# Patient Record
Sex: Female | Born: 2000 | Race: Black or African American | Hispanic: No | Marital: Single
Health system: Southern US, Community
[De-identification: ages and names within clinical notes are randomized; demographics above are authoritative.]

## PROBLEM LIST (undated history)

## (undated) HISTORY — PX: WISDOM TOOTH EXTRACTION: SHX21

---
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
## 2022-03-05 IMAGING — DX DG KNEE COMPLETE 4+V*L*
5 series · 5 of 5 positions shown · non-contrast
Comparison: December 04, 2019.

CLINICAL DATA: Acute left knee pain after injury yesterday.

EXAM:
LEFT KNEE - COMPLETE 4+ VIEW

[knee ap]
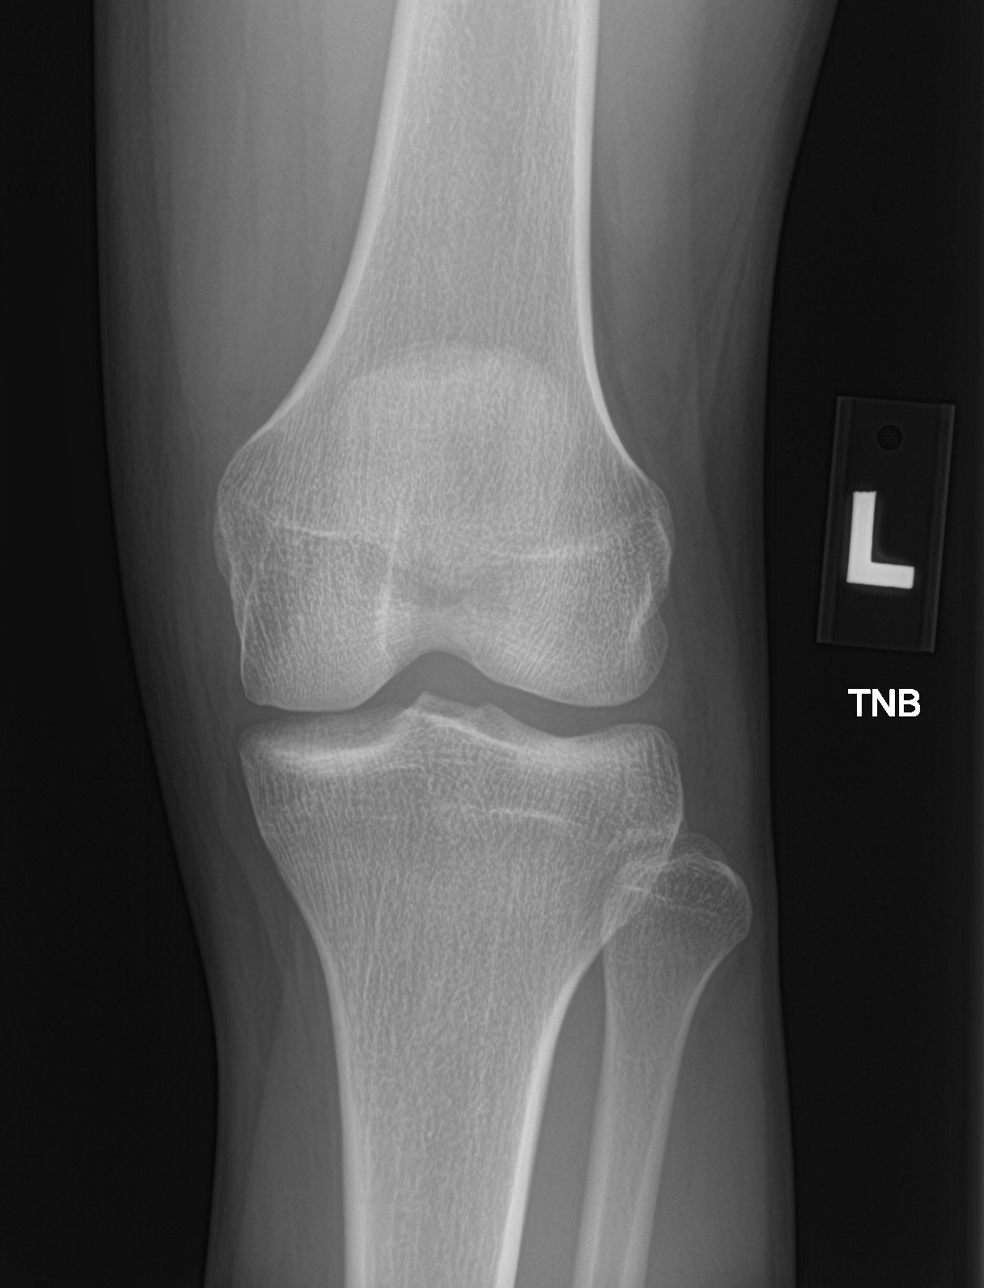

[knee obl (1 of 2)]
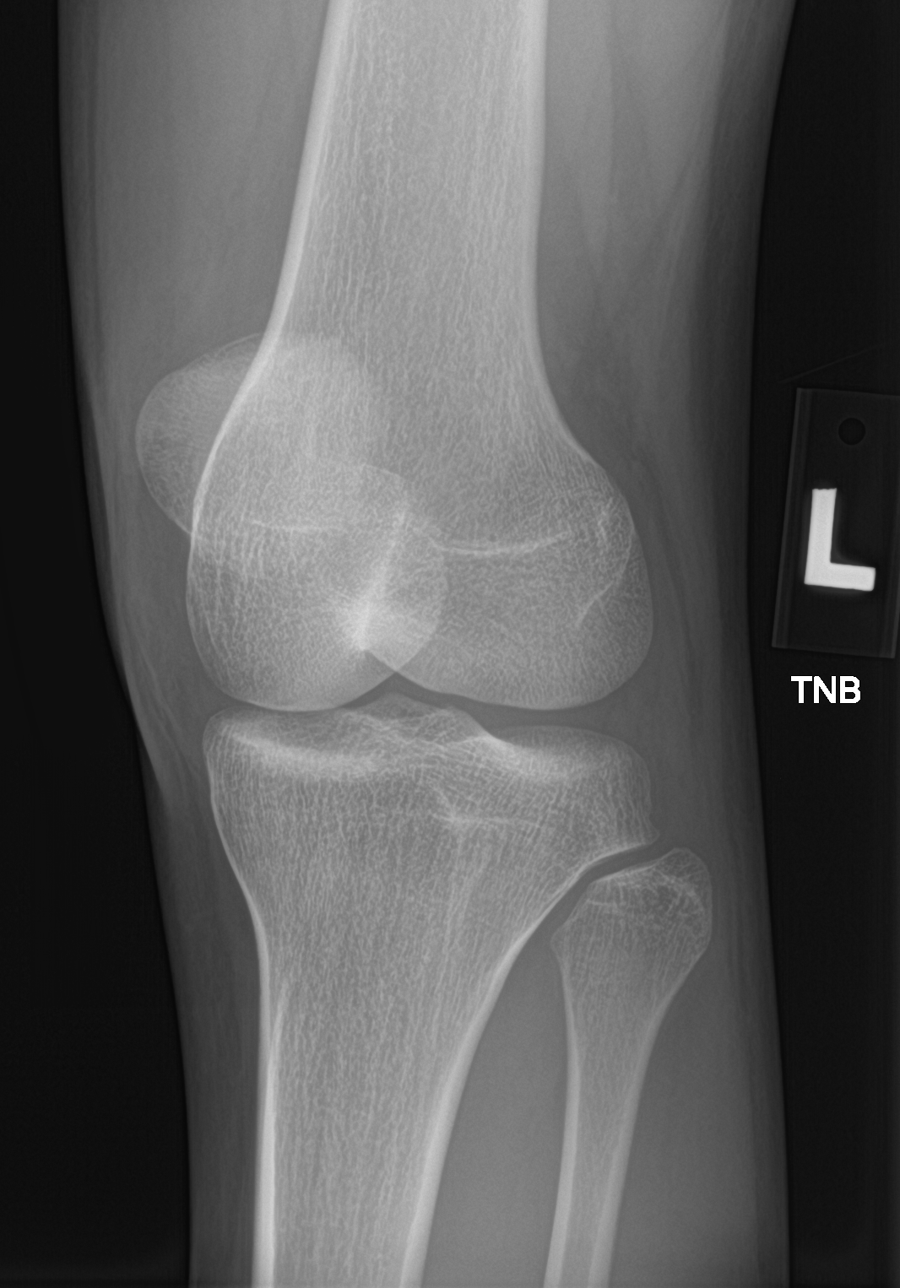

[knee obl (2 of 2)]
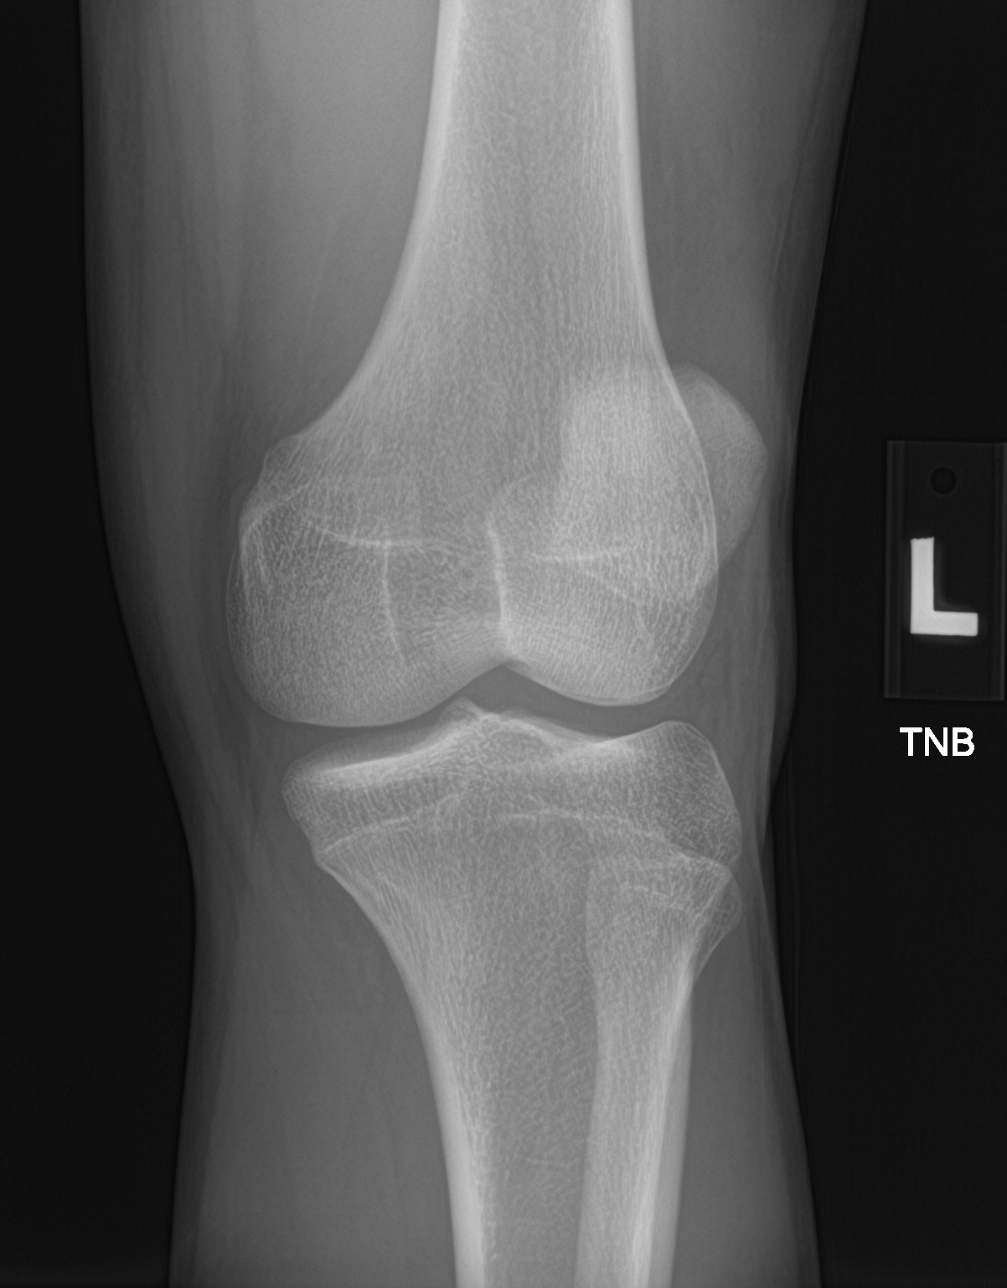

[knee lat]
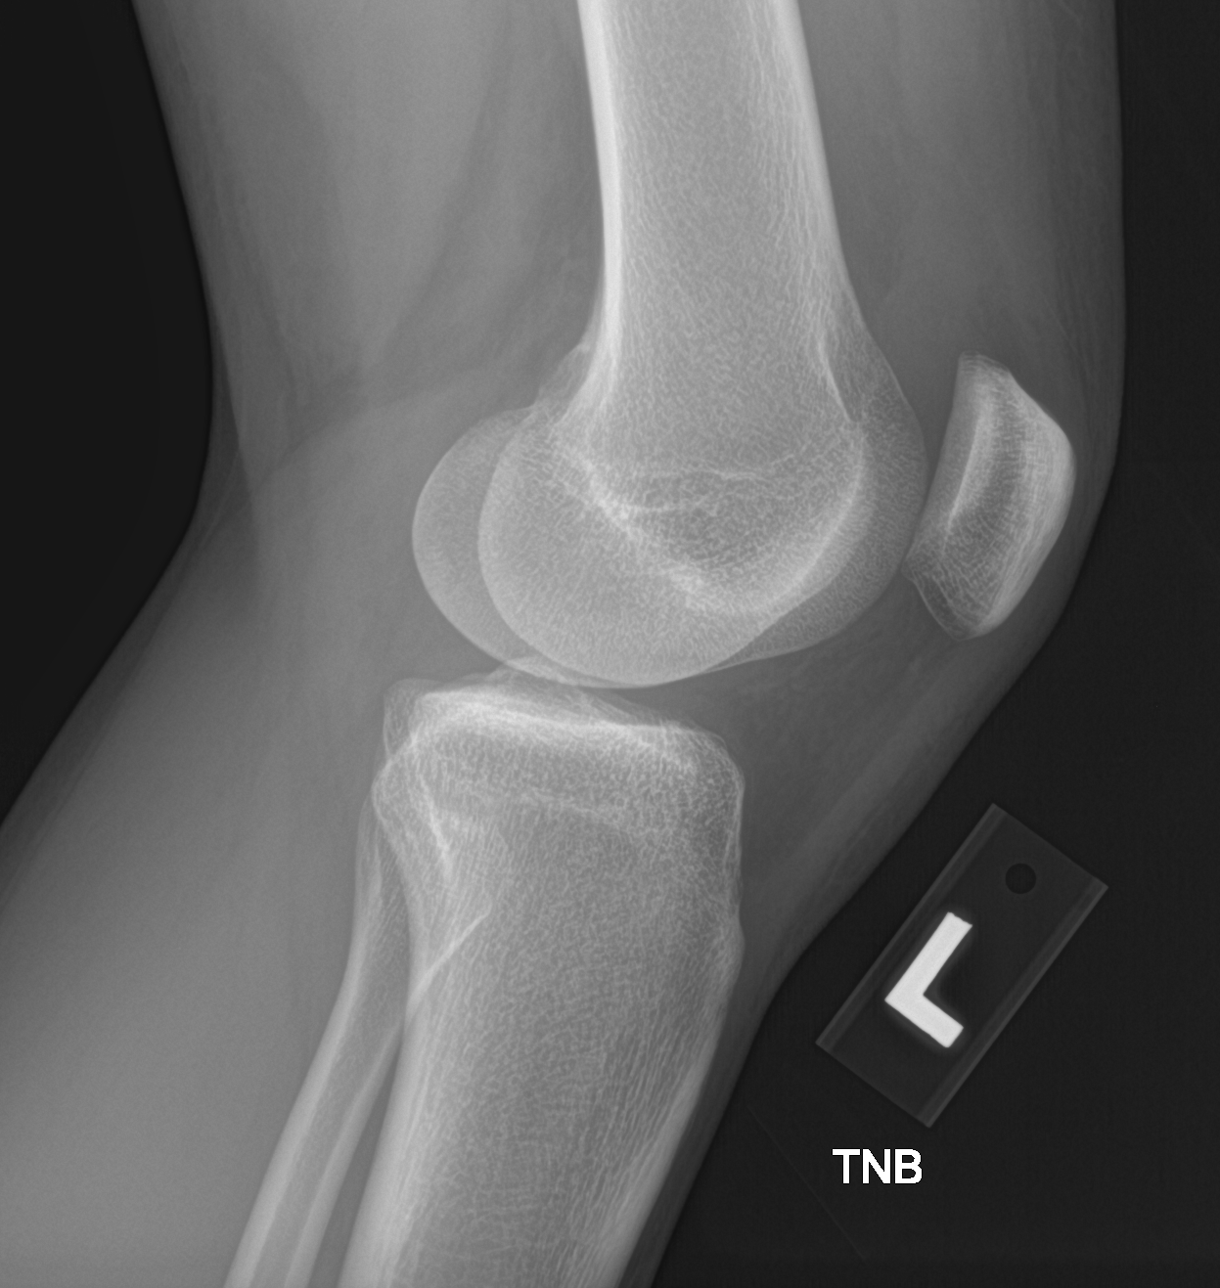

[knee sunrise]
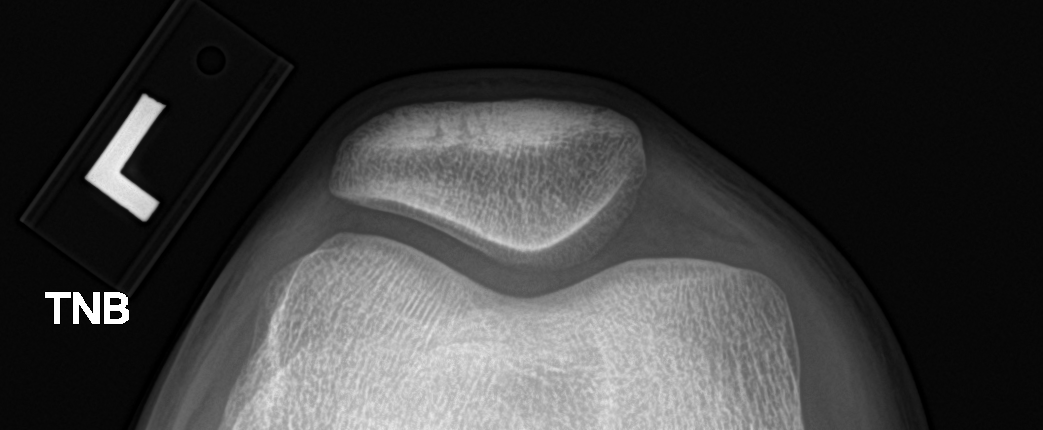

[5 of 5 positions shown; findings below may reference images not displayed]

FINDINGS: No evidence of fracture, dislocation, or joint effusion. No evidence
of arthropathy or other focal bone abnormality. Soft tissues are
unremarkable.
IMPRESSION: Negative.

## 2022-09-20 ENCOUNTER — Ambulatory Visit (HOSPITAL_COMMUNITY)
Admission: EM | Admit: 2022-09-20 | Discharge: 2022-09-20 | Disposition: A | Discharge status: Home / Self Care | Payer: Medicaid Other | Attending: Emergency Medicine | Admitting: Emergency Medicine

## 2022-09-20 ENCOUNTER — Encounter (HOSPITAL_COMMUNITY): Payer: Self-pay | Admitting: Emergency Medicine

## 2022-09-20 DIAGNOSIS — N760 Acute vaginitis: Secondary | ICD-10-CM | POA: Insufficient documentation

## 2022-09-20 MED ORDER — FLUCONAZOLE 150 MG PO TABS: 150.0000 mg | ORAL_TABLET | Freq: Once | ORAL | 0 refills | Status: AC

## 2022-09-20 NOTE — Discharge Instructions (Addendum)
Your symptoms appear consistent with a vaginal yeast infection, please take the fluconazole for this.  We have obtained a vaginal swab in clinic and we will call if we need to add any medication to your treatment plan or alter your treatment plan once those results.  Please abstain from intercourse until all results are received.  Please return to clinic for any new or concerning symptoms.

## 2022-09-20 NOTE — ED Triage Notes (Signed)
Vaginal itching and irritation starting two weeks ago, used the [employer] suppository one-day treatment with improvement of symptoms at that time. States two days ago her symptoms returned. Started menstrual cycle two days ago, denies dysuria.

## 2022-09-20 NOTE — ED Provider Notes (Signed)
[facility]    CSN: [id number] Arrival date & time: 09/20/22  1611      History   Chief Complaint Chief Complaint  Patient presents with   Vaginitis    HPI Sheila Carpenter is a 22 y.o. female.   Patient presents to clinic for vaginal itching and irritation that started 2 weeks ago.  She used a singular Monistat suppository that did help her symptoms, however, they returned 2 days ago.  She also started her menstrual cycle 2 days ago.  Is currently menstruating.  She denies dysuria, fevers or flank pain.  She is sexually active and does not use protection.   The history is provided by the patient and medical records.    Past Medical History:  Diagnosis Date   Asthma    Complete tear of anterior cruciate ligament of left knee 01/10/2020    Patient Active Problem List   Diagnosis Date Noted   Asthma    Complete tear of anterior cruciate ligament of left knee 01/10/2020   Sprain of tibiofibular ligament of right ankle 06/11/2017    Past Surgical History:  Procedure Laterality Date   KNEE ARTHROSCOPY WITH ANTERIOR CRUCIATE LIGAMENT (ACL) REPAIR Left 02/06/2020   Procedure: KNEE ARTHROSCOPY WITH ANTERIOR CRUCIATE LIGAMENT (ACL) REPAIR;  Surgeon: Salvatore Marvel, MD;  Location: [facility];  Service: Orthopedics;  Laterality: Left;   WISDOM TOOTH EXTRACTION      OB History   No obstetric history on file.      Home Medications    Prior to Admission medications   Medication Sig Start Date End Date Taking? Authorizing Provider  fluconazole (DIFLUCAN) 150 MG tablet Take 1 tablet (150 mg total) by mouth once for 1 dose. 09/20/22 09/20/22 Yes Rinaldo Ratel, [place] N, FNP  albuterol (PROVENTIL HFA;VENTOLIN HFA) 108 (90 Base) MCG/ACT inhaler Inhale 2 puffs into the lungs every 6 (six) hours as needed for wheezing or shortness of breath. 03/25/18   Sudie Grumbling, NP  oxyCODONE (OXY IR/ROXICODONE) 5 MG immediate release tablet Take 1 tablet (5 mg total) by  mouth every 4 (four) hours as needed for severe pain. 02/06/20   Shepperson, Kirstin, PA-C  promethazine (PHENERGAN) 12.5 MG tablet Take 1 tablet (12.5 mg total) by mouth every 8 (eight) hours as needed for nausea or vomiting. 02/06/20   Shepperson, Kirstin, PA-C    Family History Family History  Problem Relation Age of Onset   Healthy Mother    Hyperlipidemia Father    Hypertension Father     Social History Social History   Tobacco Use   Smoking status: Passive Smoke Exposure - Never Smoker   Smokeless tobacco: Never  Vaping Use   Vaping Use: Never used  Substance Use Topics   Alcohol use: No     Allergies   Patient has no known allergies.   Review of Systems Review of Systems  Constitutional:  Negative for fever.  Genitourinary:  Positive for vaginal discharge. Negative for difficulty urinating, dysuria, flank pain, hematuria and menstrual problem.     Physical Exam Triage Vital Signs ED Triage Vitals  Enc Vitals Group     BP 09/20/22 1632 109/77     Pulse Rate 09/20/22 1632 83     Resp 09/20/22 1632 16     Temp 09/20/22 1632 97.8 F (36.6 C)     Temp Source 09/20/22 1632 Oral     SpO2 09/20/22 1632 99 %     Weight --  Height --      Head Circumference --      Peak Flow --      Pain Score 09/20/22 1634 6     Pain Loc --      Pain Edu? --      Excl. in GC? --    No data found.  Updated Vital Signs BP 109/77 (BP Location: Left Arm)   Pulse 83   Temp 97.8 F (36.6 C) (Oral)   Resp 16   LMP 09/17/2022   SpO2 99%   Visual Acuity Right Eye Distance:   Left Eye Distance:   Bilateral Distance:    Right Eye Near:   Left Eye Near:    Bilateral Near:     Physical Exam Vitals and nursing note reviewed.  Constitutional:      Appearance: Normal appearance.  HENT:     Head: Normocephalic and atraumatic.     Right Ear: External ear normal.     Left Ear: External ear normal.     Nose: Nose normal.     Mouth/Throat:     Mouth: Mucous membranes  are moist.  Eyes:     Conjunctiva/sclera: Conjunctivae normal.  Cardiovascular:     Rate and Rhythm: Normal rate.  Pulmonary:     Effort: Pulmonary effort is normal. No respiratory distress.  Neurological:     General: No focal deficit present.     Mental Status: She is alert and oriented to person, place, and time.  Psychiatric:        Mood and Affect: Mood normal.        Behavior: Behavior normal.      UC Treatments / Results  Labs (all labs ordered are listed, but only abnormal results are displayed) Labs Reviewed  CERVICOVAGINAL ANCILLARY ONLY    EKG   Radiology No results found.  Procedures Procedures (including critical care time)  Medications Ordered in UC Medications - No data to display  Initial Impression / Assessment and Plan / UC Course  I have reviewed the triage vital signs and the nursing notes.  Pertinent labs & imaging results that were available during my care of the patient were reviewed by me and considered in my medical decision making (see chart for details).  Vitals and triage reviewed, patient is hemodynamically stable.  Symptoms consistent with vaginitis, since Monistat improved symptoms and without odor, will send home on singular dose of fluconazole.  Cytology obtained via self swab in clinic, will contact if results are abnormal, or any other treatment is warranted.  Plan of care discussed with patient, return and follow-up precautions discussed, no questions at this time.     Final Clinical Impressions(s) / UC Diagnoses   Final diagnoses:  Vaginitis and vulvovaginitis     Discharge Instructions      Your symptoms appear consistent with a vaginal yeast infection, please take the fluconazole for this.  We have obtained a vaginal swab in clinic and we will call if we need to add any medication to your treatment plan or alter your treatment plan once those results.  Please abstain from intercourse until all results are  received.  Please return to clinic for any new or concerning symptoms.     ED Prescriptions     Medication Sig Dispense Auth. Provider   fluconazole (DIFLUCAN) 150 MG tablet Take 1 tablet (150 mg total) by mouth once for 1 dose. 1 tablet Khayree Delellis, [place] N, [place]      PDMP not reviewed  this encounter.   Charde Macfarlane, [place] N, [place] 09/20/22 [phone]

## 2022-09-22 LAB — CERVICOVAGINAL ANCILLARY ONLY
Bacterial Vaginitis (gardnerella): NEGATIVE
Candida Glabrata: NEGATIVE
Candida Vaginitis: POSITIVE — AB
Chlamydia: NEGATIVE
Comment: NEGATIVE
Comment: NEGATIVE
Comment: NEGATIVE
Comment: NEGATIVE
Comment: NEGATIVE
Comment: NORMAL
Neisseria Gonorrhea: NEGATIVE
Trichomonas: NEGATIVE

## 2023-05-15 ENCOUNTER — Encounter (HOSPITAL_COMMUNITY): Payer: Self-pay

## 2023-05-15 ENCOUNTER — Ambulatory Visit (INDEPENDENT_AMBULATORY_CARE_PROVIDER_SITE_OTHER): Payer: 59

## 2023-05-15 ENCOUNTER — Ambulatory Visit (HOSPITAL_COMMUNITY)
Admission: RE | Admit: 2023-05-15 | Discharge: 2023-05-15 | Disposition: A | Discharge status: Home / Self Care | Payer: 59 | Source: Ambulatory Visit | Attending: Family Medicine | Admitting: Family Medicine

## 2023-05-15 VITALS — BP 108/59 | HR 73 | Temp 97.6°F | Resp 16

## 2023-05-15 DIAGNOSIS — S161XXA Strain of muscle, fascia and tendon at neck level, initial encounter: Secondary | ICD-10-CM | POA: Diagnosis not present

## 2023-05-15 MED ORDER — NAPROXEN 375 MG PO TABS: 375.0000 mg | ORAL_TABLET | Freq: Two times a day (BID) | ORAL | 0 refills | Status: AC

## 2023-05-15 MED ORDER — CYCLOBENZAPRINE HCL 10 MG PO TABS: 10.0000 mg | ORAL_TABLET | Freq: Every day | ORAL | 0 refills | Status: AC

## 2023-05-15 NOTE — ED Provider Notes (Signed)
 [facility]    CSN: [id number] Arrival date & time: 05/15/23  1514      History   Chief Complaint Chief Complaint  Patient presents with   Neck Injury    HPI Sheila Carpenter is a 23 y.o. female.   Patient presents for right sided neck injury after sustaining a fall while getting out of the shower. Patient endorses taking a hot shower when she got out of the shower, she reports passing out and injuring her neck. This injury occurred 5 days ago. She has been taking motrin  for pain without relief. Endorses intermittent pain and neck stiffness, however remains full ROM.  Past Medical History:  Diagnosis Date   Asthma    Complete tear of anterior cruciate ligament of left knee 01/10/2020    Patient Active Problem List   Diagnosis Date Noted   Asthma    Complete tear of anterior cruciate ligament of left knee 01/10/2020   Sprain of tibiofibular ligament of right ankle 06/11/2017    Past Surgical History:  Procedure Laterality Date   KNEE ARTHROSCOPY WITH ANTERIOR CRUCIATE LIGAMENT (ACL) REPAIR Left 02/06/2020   Procedure: KNEE ARTHROSCOPY WITH ANTERIOR CRUCIATE LIGAMENT (ACL) REPAIR;  Surgeon: Jane Charleston, MD;  Location: [facility];  Service: Orthopedics;  Laterality: Left;   WISDOM TOOTH EXTRACTION      OB History   No obstetric history on file.      Home Medications    Prior to Admission medications   Medication Sig Start Date End Date Taking? Authorizing Provider  cyclobenzaprine  (FLEXERIL ) 10 MG tablet Take 1 tablet (10 mg total) by mouth at bedtime. 05/15/23  Yes Arloa Suzen RAMAN, NP  naproxen  (NAPROSYN ) 375 MG tablet Take 1 tablet (375 mg total) by mouth 2 (two) times daily. 05/15/23  Yes Arloa Suzen RAMAN, NP  albuterol  (PROVENTIL  HFA;VENTOLIN  HFA) 108 (90 Base) MCG/ACT inhaler Inhale 2 puffs into the lungs every 6 (six) hours as needed for wheezing or shortness of breath. 03/25/18   Pearl Jenkins Lesches, NP  oxyCODONE  (OXY IR/ROXICODONE )  5 MG immediate release tablet Take 1 tablet (5 mg total) by mouth every 4 (four) hours as needed for severe pain. 02/06/20   Shepperson, Kirstin, PA-C  promethazine  (PHENERGAN ) 12.5 MG tablet Take 1 tablet (12.5 mg total) by mouth every 8 (eight) hours as needed for nausea or vomiting. 02/06/20   Shepperson, Kirstin, PA-C    Family History Family History  Problem Relation Age of Onset   Healthy Mother    Hyperlipidemia Father    Hypertension Father     Social History Social History   Tobacco Use   Smoking status: Passive Smoke Exposure - Never Smoker   Smokeless tobacco: Never  Vaping Use   Vaping status: Never Used  Substance Use Topics   Alcohol use: No     Allergies   Patient has no known allergies.   Review of Systems Review of Systems Pertinent negatives listed in HPI   Physical Exam Triage Vital Signs ED Triage Vitals [05/15/23 1541]  Encounter Vitals Group     BP (!) 108/59     Systolic BP Percentile      Diastolic BP Percentile      Pulse Rate 73     Resp 16     Temp 97.6 F (36.4 C)     Temp Source Oral     SpO2 99 %     Weight      Height  Head Circumference      Peak Flow      Pain Score 6     Pain Loc      Pain Education      Exclude from Growth Chart    No data found.  Updated Vital Signs BP (!) 108/59 (BP Location: Left Arm)   Pulse 73   Temp 97.6 F (36.4 C) (Oral)   Resp 16   LMP 04/19/2023 (Exact Date)   SpO2 99%   Visual Acuity Right Eye Distance:   Left Eye Distance:   Bilateral Distance:    Right Eye Near:   Left Eye Near:    Bilateral Near:     Physical Exam Constitutional:      Appearance: Normal appearance.  HENT:     Head: Normocephalic and atraumatic.     Right Ear: External ear normal.     Nose: Nose normal.  Neck:     Vascular: No carotid bruit.  Cardiovascular:     Rate and Rhythm: Normal rate and regular rhythm.  Pulmonary:     Effort: Pulmonary effort is normal.     Breath sounds: Normal breath  sounds.  Musculoskeletal:        General: Normal range of motion.     Cervical back: Normal range of motion and neck supple. Tenderness present. No rigidity. Pain with movement and muscular tenderness present. No spinous process tenderness.  Lymphadenopathy:     Cervical: No cervical adenopathy.  Skin:    General: Skin is warm and dry.  Neurological:     General: No focal deficit present.     Mental Status: She is alert.     UC Treatments / Results  Labs (all labs ordered are listed, but only abnormal results are displayed) Labs Reviewed - No data to display  EKG   Radiology DG Cervical Spine Complete Result Date: 05/15/2023 CLINICAL DATA:  Fall 5 days ago. EXAM: CERVICAL SPINE - COMPLETE 4+ VIEW COMPARISON:  None Available. FINDINGS: There is no evidence of cervical spine fracture or prevertebral soft tissue swelling. Alignment is normal. No other significant bone abnormalities are identified. IMPRESSION: Negative cervical spine radiographs. Electronically Signed   By: Greig Pique M.D.   On: 05/15/2023 18:27    Procedures Procedures (including critical care time)  Medications Ordered in UC Medications - No data to display  Initial Impression / Assessment and Plan / UC Course  I have reviewed the triage vital signs and the nursing notes.  Pertinent labs & imaging results that were available during my care of the patient were reviewed by me and considered in my medical decision making (see chart for details).   Neck strain injury related to a recent fall. Cervical spine plan films reviewed and interpreted by this writer, were negative for any acute fracture or deformity.  Radiology over -read pending. For neck strain pain and stiffness, start naprosyn  375 mg BID PRN and Flexeril  10 mg QHSPRN. Schedule an appointment with PCP for complete physical exam. ED if symptoms worsen. Patient verbalized understanding and agreement with plan. Final Clinical Impressions(s) / UC Diagnoses    Final diagnoses:  Neck strain, initial encounter   Discharge Instructions   None    ED Prescriptions     Medication Sig Dispense Auth. Provider   naproxen  (NAPROSYN ) 375 MG tablet Take 1 tablet (375 mg total) by mouth 2 (two) times daily. 20 tablet Arloa Suzen RAMAN, NP   cyclobenzaprine  (FLEXERIL ) 10 MG tablet Take 1 tablet (10 mg  total) by mouth at bedtime. 20 tablet Arloa Suzen RAMAN, NP      PDMP not reviewed this encounter.   Arloa Suzen RAMAN, NP 05/17/23 1028

## 2023-05-15 NOTE — ED Triage Notes (Addendum)
 Pt states she passed out in the shower 5 days ago states her neck feels stiff. Abrasion noted to right side of neck.

## 2023-05-16 NOTE — Progress Notes (Signed)
Normal cervical spine xray

## 2023-08-21 ENCOUNTER — Ambulatory Visit (HOSPITAL_COMMUNITY)
Admission: RE | Admit: 2023-08-21 | Discharge: 2023-08-21 | Disposition: A | Discharge status: Home / Self Care | Source: Ambulatory Visit | Attending: Family Medicine | Admitting: Family Medicine

## 2023-08-21 ENCOUNTER — Other Ambulatory Visit: Payer: Self-pay

## 2023-08-21 ENCOUNTER — Encounter (HOSPITAL_COMMUNITY): Payer: Self-pay

## 2023-08-21 VITALS — BP 111/75 | HR 78 | Temp 98.0°F | Resp 16

## 2023-08-21 DIAGNOSIS — J069 Acute upper respiratory infection, unspecified: Secondary | ICD-10-CM | POA: Diagnosis not present

## 2023-08-21 LAB — POC COVID19/FLU A&B COMBO
Covid Antigen, POC: NEGATIVE
Influenza A Antigen, POC: NEGATIVE
Influenza B Antigen, POC: NEGATIVE

## 2023-08-21 MED ORDER — BENZONATATE 100 MG PO CAPS: 100.0000 mg | ORAL_CAPSULE | Freq: Three times a day (TID) | ORAL | 0 refills | Status: AC | PRN

## 2023-08-21 NOTE — Discharge Instructions (Signed)
 Test for flu and COVID was negative.  Take benzonatate 100 mg, 1 tab every 8 hours as needed for cough.  You can also take DayQuil/NyQuil as needed for congestion and cough

## 2023-08-21 NOTE — ED Provider Notes (Signed)
 [facility]    CSN: [id number] Arrival date & time: 08/21/23  1327      History   Chief Complaint Chief Complaint  Patient presents with   Cough    HPI Sheila Carpenter is a 23 y.o. female.    Cough Here for cough/nasal congestion and sore throat. No f/c/n/v/d.   NKDA  LMP started yesterday.  Has a h/o asthma, but no problem in years. Has not been wheezing or feeling short of breath in a long time. No problem during this illness   Past Medical History:  Diagnosis Date   Asthma    Complete tear of anterior cruciate ligament of left knee 01/10/2020    Patient Active Problem List   Diagnosis Date Noted   Asthma    Complete tear of anterior cruciate ligament of left knee 01/10/2020   Sprain of tibiofibular ligament of right ankle 06/11/2017    Past Surgical History:  Procedure Laterality Date   KNEE ARTHROSCOPY WITH ANTERIOR CRUCIATE LIGAMENT (ACL) REPAIR Left 02/06/2020   Procedure: KNEE ARTHROSCOPY WITH ANTERIOR CRUCIATE LIGAMENT (ACL) REPAIR;  Surgeon: Salvatore Marvel, MD;  Location: [facility];  Service: Orthopedics;  Laterality: Left;   WISDOM TOOTH EXTRACTION      OB History   No obstetric history on file.      Home Medications    Prior to Admission medications   Medication Sig Start Date End Date Taking? Authorizing Provider  benzonatate (TESSALON) 100 MG capsule Take 1 capsule (100 mg total) by mouth 3 (three) times daily as needed for cough. 08/21/23  Yes Yanni Ruberg, Janace Aris, MD  albuterol (PROVENTIL HFA;VENTOLIN HFA) 108 (90 Base) MCG/ACT inhaler Inhale 2 puffs into the lungs every 6 (six) hours as needed for wheezing or shortness of breath. 03/25/18   Sudie Grumbling, NP  cyclobenzaprine (FLEXERIL) 10 MG tablet Take 1 tablet (10 mg total) by mouth at bedtime. 05/15/23   Bing Neighbors, NP  naproxen (NAPROSYN) 375 MG tablet Take 1 tablet (375 mg total) by mouth 2 (two) times daily. 05/15/23   Bing Neighbors, NP   oxyCODONE (OXY IR/ROXICODONE) 5 MG immediate release tablet Take 1 tablet (5 mg total) by mouth every 4 (four) hours as needed for severe pain. 02/06/20   Shepperson, Kirstin, PA-C  promethazine (PHENERGAN) 12.5 MG tablet Take 1 tablet (12.5 mg total) by mouth every 8 (eight) hours as needed for nausea or vomiting. 02/06/20   Shepperson, Kirstin, PA-C    Family History Family History  Problem Relation Age of Onset   Healthy Mother    Hyperlipidemia Father    Hypertension Father     Social History Social History   Tobacco Use   Smoking status: Passive Smoke Exposure - Never Smoker   Smokeless tobacco: Never  Vaping Use   Vaping status: Never Used  Substance Use Topics   Alcohol use: No     Allergies   Patient has no known allergies.   Review of Systems Review of Systems  Respiratory:  Positive for cough.      Physical Exam Triage Vital Signs ED Triage Vitals  Encounter Vitals Group     BP 08/21/23 1351 111/75     Systolic BP Percentile --      Diastolic BP Percentile --      Pulse Rate 08/21/23 1351 78     Resp 08/21/23 1351 16     Temp 08/21/23 1351 98 F (36.7 C)     Temp Source 08/21/23  1351 Oral     SpO2 08/21/23 1351 99 %     Weight --      Height --      Head Circumference --      Peak Flow --      Pain Score 08/21/23 1352 3     Pain Loc --      Pain Education --      Exclude from Growth Chart --    No data found.  Updated Vital Signs BP 111/75 (BP Location: Right Arm)   Pulse 78   Temp 98 F (36.7 C) (Oral)   Resp 16   LMP 08/06/2023 (Approximate)   SpO2 99%   Visual Acuity Right Eye Distance:   Left Eye Distance:   Bilateral Distance:    Right Eye Near:   Left Eye Near:    Bilateral Near:     Physical Exam Vitals reviewed.  Constitutional:      General: She is not in acute distress.    Appearance: She is not toxic-appearing.  HENT:     Right Ear: Tympanic membrane and ear canal normal.     Left Ear: Tympanic membrane and ear  canal normal.     Nose: Congestion present.     Mouth/Throat:     Mouth: Mucous membranes are moist.     Comments: There is white mucus draining in the posterior OP. No erythema. Tonsils are 2+ in size Eyes:     Extraocular Movements: Extraocular movements intact.     Conjunctiva/sclera: Conjunctivae normal.     Pupils: Pupils are equal, round, and reactive to light.  Cardiovascular:     Rate and Rhythm: Normal rate and regular rhythm.     Heart sounds: No murmur heard. Pulmonary:     Effort: Pulmonary effort is normal. No respiratory distress.     Breath sounds: No stridor. No wheezing, rhonchi or rales.  Musculoskeletal:     Cervical back: Neck supple.  Lymphadenopathy:     Cervical: No cervical adenopathy.  Skin:    Capillary Refill: Capillary refill takes less than 2 seconds.     Coloration: Skin is not jaundiced or pale.  Neurological:     General: No focal deficit present.     Mental Status: She is alert and oriented to person, place, and time.  Psychiatric:        Behavior: Behavior normal.      UC Treatments / Results  Labs (all labs ordered are listed, but only abnormal results are displayed) Labs Reviewed  POC COVID19/FLU A&B COMBO    EKG   Radiology No results found.  Procedures Procedures (including critical care time)  Medications Ordered in UC Medications - No data to display  Initial Impression / Assessment and Plan / UC Course  I have reviewed the triage vital signs and the nursing notes.  Pertinent labs & imaging results that were available during my care of the patient were reviewed by me and considered in my medical decision making (see chart for details).     COVID and flu swab are negative. Tessalon Perles were sent in for cough.  Work note provided Final Clinical Impressions(s) / UC Diagnoses   Final diagnoses:  Viral URI with cough     Discharge Instructions      Test for flu and COVID was negative.  Take benzonatate 100  mg, 1 tab every 8 hours as needed for cough.  You can also take DayQuil/NyQuil as needed for congestion and cough  ED Prescriptions     Medication Sig Dispense Auth. Provider   benzonatate (TESSALON) 100 MG capsule Take 1 capsule (100 mg total) by mouth 3 (three) times daily as needed for cough. 21 capsule Zenia Resides, MD      PDMP not reviewed this encounter.   Zenia Resides, MD 08/21/23 [id number]

## 2023-08-21 NOTE — ED Triage Notes (Signed)
 Persistent cough since yesterday. Taking OTC medication with no relief.
